# Patient Record
Sex: Female | Born: 1945 | Hispanic: Yes | Smoking: Never smoker
Health system: Southern US, Community
[De-identification: ages and names within clinical notes are randomized; demographics above are authoritative.]

## PROBLEM LIST (undated history)

## (undated) DIAGNOSIS — I10 Essential (primary) hypertension: Secondary | ICD-10-CM

## (undated) DIAGNOSIS — E119 Type 2 diabetes mellitus without complications: Secondary | ICD-10-CM

---

## 2017-04-09 ENCOUNTER — Emergency Department: Payer: Self-pay

## 2017-04-09 DIAGNOSIS — Z79899 Other long term (current) drug therapy: Secondary | ICD-10-CM | POA: Insufficient documentation

## 2017-04-09 DIAGNOSIS — R0602 Shortness of breath: Secondary | ICD-10-CM | POA: Insufficient documentation

## 2017-04-09 DIAGNOSIS — I10 Essential (primary) hypertension: Secondary | ICD-10-CM | POA: Insufficient documentation

## 2017-04-09 DIAGNOSIS — E119 Type 2 diabetes mellitus without complications: Secondary | ICD-10-CM | POA: Insufficient documentation

## 2017-04-09 LAB — COMPREHENSIVE METABOLIC PANEL
ALT: 18 U/L (ref 14–54)
AST: 23 U/L (ref 15–41)
Albumin: 4.7 g/dL (ref 3.5–5.0)
Alkaline Phosphatase: 72 U/L (ref 38–126)
Anion gap: 9 (ref 5–15)
BUN: 19 mg/dL (ref 6–20)
CO2: 24 mmol/L (ref 22–32)
Calcium: 9.5 mg/dL (ref 8.9–10.3)
Chloride: 97 mmol/L — ABNORMAL LOW (ref 101–111)
Creatinine, Ser: 0.6 mg/dL (ref 0.44–1.00)
GFR calc Af Amer: >60 mL/min (ref >60)
GFR calc non Af Amer: >60 mL/min (ref >60)
Glucose, Bld: 212 mg/dL — ABNORMAL HIGH (ref 65–99)
Potassium: 4.2 mmol/L (ref 3.5–5.1)
Sodium: 130 mmol/L — ABNORMAL LOW (ref 135–145)
Total Bilirubin: 0.8 mg/dL (ref 0.3–1.2)
Total Protein: 8 g/dL (ref 6.5–8.1)

## 2017-04-09 LAB — CBC WITH DIFFERENTIAL/PLATELET
Basophils Absolute: 0 10*3/uL (ref 0–0.1)
Basophils Relative: 1 %
Eosinophils Absolute: 0.2 10*3/uL (ref 0–0.7)
Eosinophils Relative: 5 %
HCT: 35.8 % (ref 35.0–47.0)
Hemoglobin: 12.5 g/dL (ref 12.0–16.0)
Lymphocytes Relative: 40 %
Lymphs Abs: 1.7 10*3/uL (ref 1.0–3.6)
MCH: 29.8 pg (ref 26.0–34.0)
MCHC: 34.8 g/dL (ref 32.0–36.0)
MCV: 85.6 fL (ref 80.0–100.0)
Monocytes Absolute: 0.3 10*3/uL (ref 0.2–0.9)
Monocytes Relative: 7 %
Neutro Abs: 2 10*3/uL (ref 1.4–6.5)
Neutrophils Relative %: 47 %
Platelets: 159 10*3/uL (ref 150–440)
RBC: 4.18 MIL/uL (ref 3.80–5.20)
RDW: 13.1 % (ref 11.5–14.5)
WBC: 4.1 10*3/uL (ref 3.6–11.0)

## 2017-04-09 LAB — TROPONIN I

## 2017-04-09 NOTE — ED Triage Notes (Signed)
Per family patient complaining of not feeling well and having shortness of breath.  Patient is visiting.

## 2017-04-10 ENCOUNTER — Encounter: Payer: Self-pay | Admitting: Emergency Medicine

## 2017-04-10 ENCOUNTER — Emergency Department
Admission: EM | Admit: 2017-04-10 | Discharge: 2017-04-10 | Disposition: A | Discharge status: Home / Self Care | Payer: Self-pay | Attending: Emergency Medicine | Admitting: Emergency Medicine

## 2017-04-10 DIAGNOSIS — R0602 Shortness of breath: Secondary | ICD-10-CM

## 2017-04-10 HISTORY — DX: Essential (primary) hypertension: I10

## 2017-04-10 HISTORY — DX: Type 2 diabetes mellitus without complications: E11.9

## 2017-04-10 LAB — TROPONIN I: Troponin I: <0.03 ng/mL (ref <0.03)

## 2017-04-10 MED ORDER — AMLODIPINE BESYLATE 5 MG PO TABS: 5.0000 mg | ORAL_TABLET | Freq: Every day | ORAL | 0 refills | Status: AC

## 2017-04-10 MED ORDER — PREGABALIN 75 MG PO CAPS: 75.0000 mg | ORAL_CAPSULE | Freq: Every day | ORAL | 0 refills | Status: AC

## 2017-04-10 NOTE — ED Provider Notes (Signed)
[facility] Emergency Department Provider Note  ____________________________________________  Time seen: Approximately 1:24 AM  I have reviewed the triage vital signs and the nursing notes.   HISTORY  Chief Complaint Shortness of Breath   HPI Debra Robbins is a 71 y.o. female with a history of diabetes and hypertension who presents for evaluation of shortness of breath. Patient is here visiting from [place]. She reports that she is on amlodipine, Coreg, and Ibersartan twice a day for her high blood pressure.Since she does not have enough amlodipine to last until she returns to [place] she has been taking it only once a day. This evening just prior to arrival patient reports that she started having whole body shaking. She was conscious during this episode, no history of seizure, no urinary or bowel loss, no postictal phase. This is usually how she feels every time her blood pressure is high. She did not check her blood pressure but took all 3 of her meds. She reports moderate constant shortness of breath associated with this episode. Upon arrival to the emergency room patient's symptoms had already resolved. She denies chest pain, dizziness, slurred speech, facial droop, you unilateral weakness or numbness of her extremities, headache. She has no history of heart failure. She denies URI symptoms. She is not a smoker. She denies family history of ischemic heart disease.  Past Medical History:  Diagnosis Date  . Diabetes mellitus without complication (HCC)   . Hypertension     There are no active problems to display for this patient.   History reviewed. No pertinent surgical history.  Prior to Admission medications   Medication Sig Start Date End Date Taking? Authorizing Provider  amLODipine (NORVASC) 5 MG tablet Take 5 mg by mouth 2 (two) times daily.   Yes [provider]  carvedilol (COREG) 12.5 MG tablet Take 12.5 mg by mouth 2 (two)  times daily with a meal.   Yes [provider]  cilostazol (PLETAL) 100 MG tablet Take 100 mg by mouth daily.   Yes [provider]  dapagliflozin propanediol (FARXIGA) 10 MG TABS tablet Take 10 mg by mouth daily.   Yes [provider]  irbesartan (AVAPRO) 150 MG tablet Take 150 mg by mouth 2 (two) times daily.   Yes [provider]  pregabalin (LYRICA) 75 MG capsule Take 75 mg by mouth at bedtime.   Yes [provider]  amLODipine (NORVASC) 5 MG tablet Take 1 tablet (5 mg total) by mouth daily. 04/10/17 05/10/17  Nita Sickle, MD  pregabalin (LYRICA) 75 MG capsule Take 1 capsule (75 mg total) by mouth at bedtime. 04/10/17 04/20/17  Nita Sickle, MD    Allergies Patient has no known allergies.  Family History No fh of ischemic heart disease, asthma, or COPD  Social History Social History  Substance Use Topics  . Smoking status: Never Smoker  . Smokeless tobacco: Never Used  . Alcohol use No    Review of Systems  Constitutional: Negative for fever. + whole body shaking Eyes: Negative for visual changes. ENT: Negative for sore throat. Neck: No neck pain  Cardiovascular: Negative for chest pain. Respiratory: + shortness of breath. Gastrointestinal: Negative for abdominal pain, vomiting or diarrhea. Genitourinary: Negative for dysuria. Musculoskeletal: Negative for back pain. Skin: Negative for rash. Neurological: Negative for headaches, weakness or numbness. Psych: No SI or HI  ____________________________________________   PHYSICAL EXAM:  VITAL SIGNS: ED Triage Vitals  Enc Vitals Group     BP 04/09/17 2303 Marland Kitchen)  179/94     Pulse Rate 04/09/17 2303 80     Resp 04/09/17 2303 (!) 22     Temp 04/09/17 2306 97.9 F (36.6 C)     Temp Source 04/09/17 2306 Oral     SpO2 04/09/17 2303 98 %     Weight 04/09/17 2307 138 lb (62.6 kg)     Height --      Head Circumference --      Peak Flow --      Pain Score --      Pain Loc  --      Pain Edu? --      Excl. in GC? --     Constitutional: Alert and oriented. Well appearing and in no apparent distress. HEENT:      Head: Normocephalic and atraumatic.         Eyes: Conjunctivae are normal. Sclera is non-icteric.       Mouth/Throat: Mucous membranes are moist.       Neck: Supple with no signs of meningismus. Cardiovascular: Regular rate and rhythm. No murmurs, gallops, or rubs. 2+ symmetrical distal pulses are present in all extremities. No JVD. Respiratory: Normal respiratory effort. Lungs are clear to auscultation bilaterally. No wheezes, crackles, or rhonchi.  Gastrointestinal: Soft, non tender, and non distended with positive bowel sounds. No rebound or guarding. Musculoskeletal: Nontender with normal range of motion in all extremities. No edema, cyanosis, or erythema of extremities. Neurologic: Normal speech and language. A & O x3, PERRL, no nystagmus, CN II-XII intact, motor testing reveals good tone and bulk throughout. There is no evidence of pronator drift or dysmetria. Muscle strength is 5/5 throughout. Deep tendon reflexes are 2+ throughout with downgoing toes. Sensory examination is intact. Gait is normal. Skin: Skin is warm, dry and intact. No rash noted. Psychiatric: Mood and affect are normal. Speech and behavior are normal.  ____________________________________________   LABS (all labs ordered are listed, but only abnormal results are displayed)  Labs Reviewed  COMPREHENSIVE METABOLIC PANEL - Abnormal; Notable for the following:       Result Value   Sodium 130 (*)    Chloride 97 (*)    Glucose, Bld 212 (*)    All other components within normal limits  CBC WITH DIFFERENTIAL/PLATELET  TROPONIN I  TROPONIN I   ____________________________________________  EKG  ED ECG REPORT I, Nita Sickle, the attending physician, personally viewed and interpreted this ECG.  Normal sinus rhythm, rate of 75, normal intervals, normal axis, no ST  elevations or depressions, Q-wave in lead 3, and anterior leads. No prior for comparison.  ____________________________________________  RADIOLOGY  CXR: Slight linear fibrosis or atelectasis in the lung bases. No consolidation or edema.  ____________________________________________   PROCEDURES  Procedure(s) performed: None Procedures Critical Care performed:  None ____________________________________________   INITIAL IMPRESSION / ASSESSMENT AND PLAN / ED COURSE  71 y.o. female with a history of diabetes and hypertension who presents for evaluation of short lived episode of whole body tremor and shortness of breath which patient usually gets when her blood pressure is high, in the setting of noncompliance with her medication. Patient's blood pressure in the emergency room is 179/94. She is asymptomatic at this time. EKG with no acute ischemic changes. Troponin 1 is negative. Chest x-ray with no acute findings. We'll monitor patient for 3 hours and cycle cardiac enzymes. His troponin 2 is negative we'll plan to discharge home and provide patient with a prescription for amlodipine    ----------------------------------------- 12:57  PM on 04/09/2017 -----------------------------------------   OBSERVATION CARE: This patient is being placed under observation care for the following reasons: Chest pain with repeat testing to rule out ischemia  ----------------------------------------- 01:57 AM on 04/10/2017 -----------------------------------------  Patient remains stable with no SOB or CP.  ----------------------------------------- 2:37 AM on 04/10/2017 -----------------------------------------   END OF OBSERVATION STATUS: After an appropriate period of observation, this patient is being discharged due to the following reason(s):  Patient remains asymptomatic. Troponin 2 is negative. She is going to be discharged home at this time and recommended follow-up in the emergency room  if her symptoms recur. She is provided with a prescription for her home medications that she does not have so they can last until she returns to [place].     Pertinent labs & imaging results that were available during my care of the patient were reviewed by me and considered in my medical decision making (see chart for details).    ____________________________________________   FINAL CLINICAL IMPRESSION(S) / ED DIAGNOSES  Final diagnoses:  SOB (shortness of breath)      NEW MEDICATIONS STARTED DURING THIS VISIT:  New Prescriptions   AMLODIPINE (NORVASC) 5 MG TABLET    Take 1 tablet (5 mg total) by mouth daily.   PREGABALIN (LYRICA) 75 MG CAPSULE    Take 1 capsule (75 mg total) by mouth at bedtime.     Note:  This document was prepared using Dragon voice recognition software and may include unintentional dictation errors.    Nita SickleVeronese, [place], MD 04/10/17 [phone]

## 2018-02-18 IMAGING — CR DG CHEST 2V
1 series · 2 of 2 positions shown · non-contrast
Comparison: None.

CLINICAL DATA: Patient complains of not feeling well and shortness
of breath today.

EXAM:
CHEST  2 VIEW

[Series 1: dg chest 2 view · 0.14mm/px · 2 of 2 slices shown]
[im 1/2]
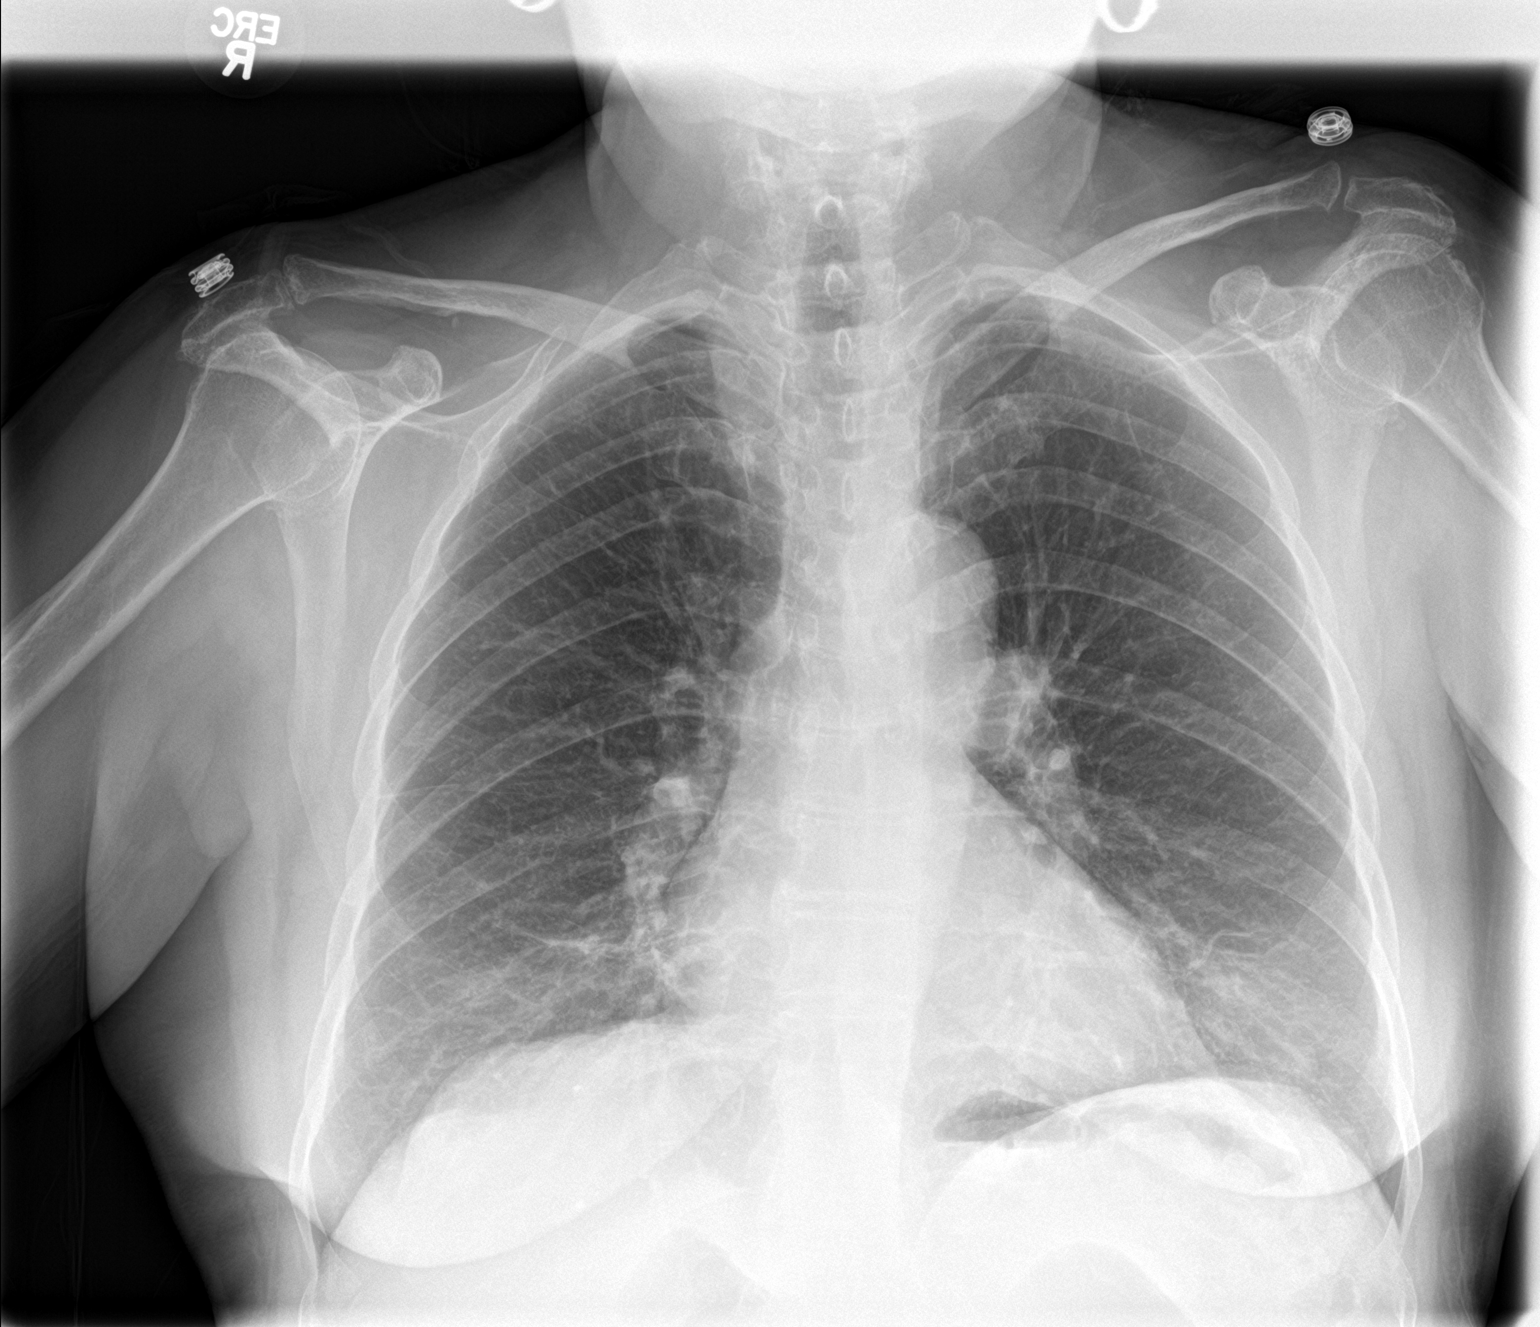
[im 2/2]
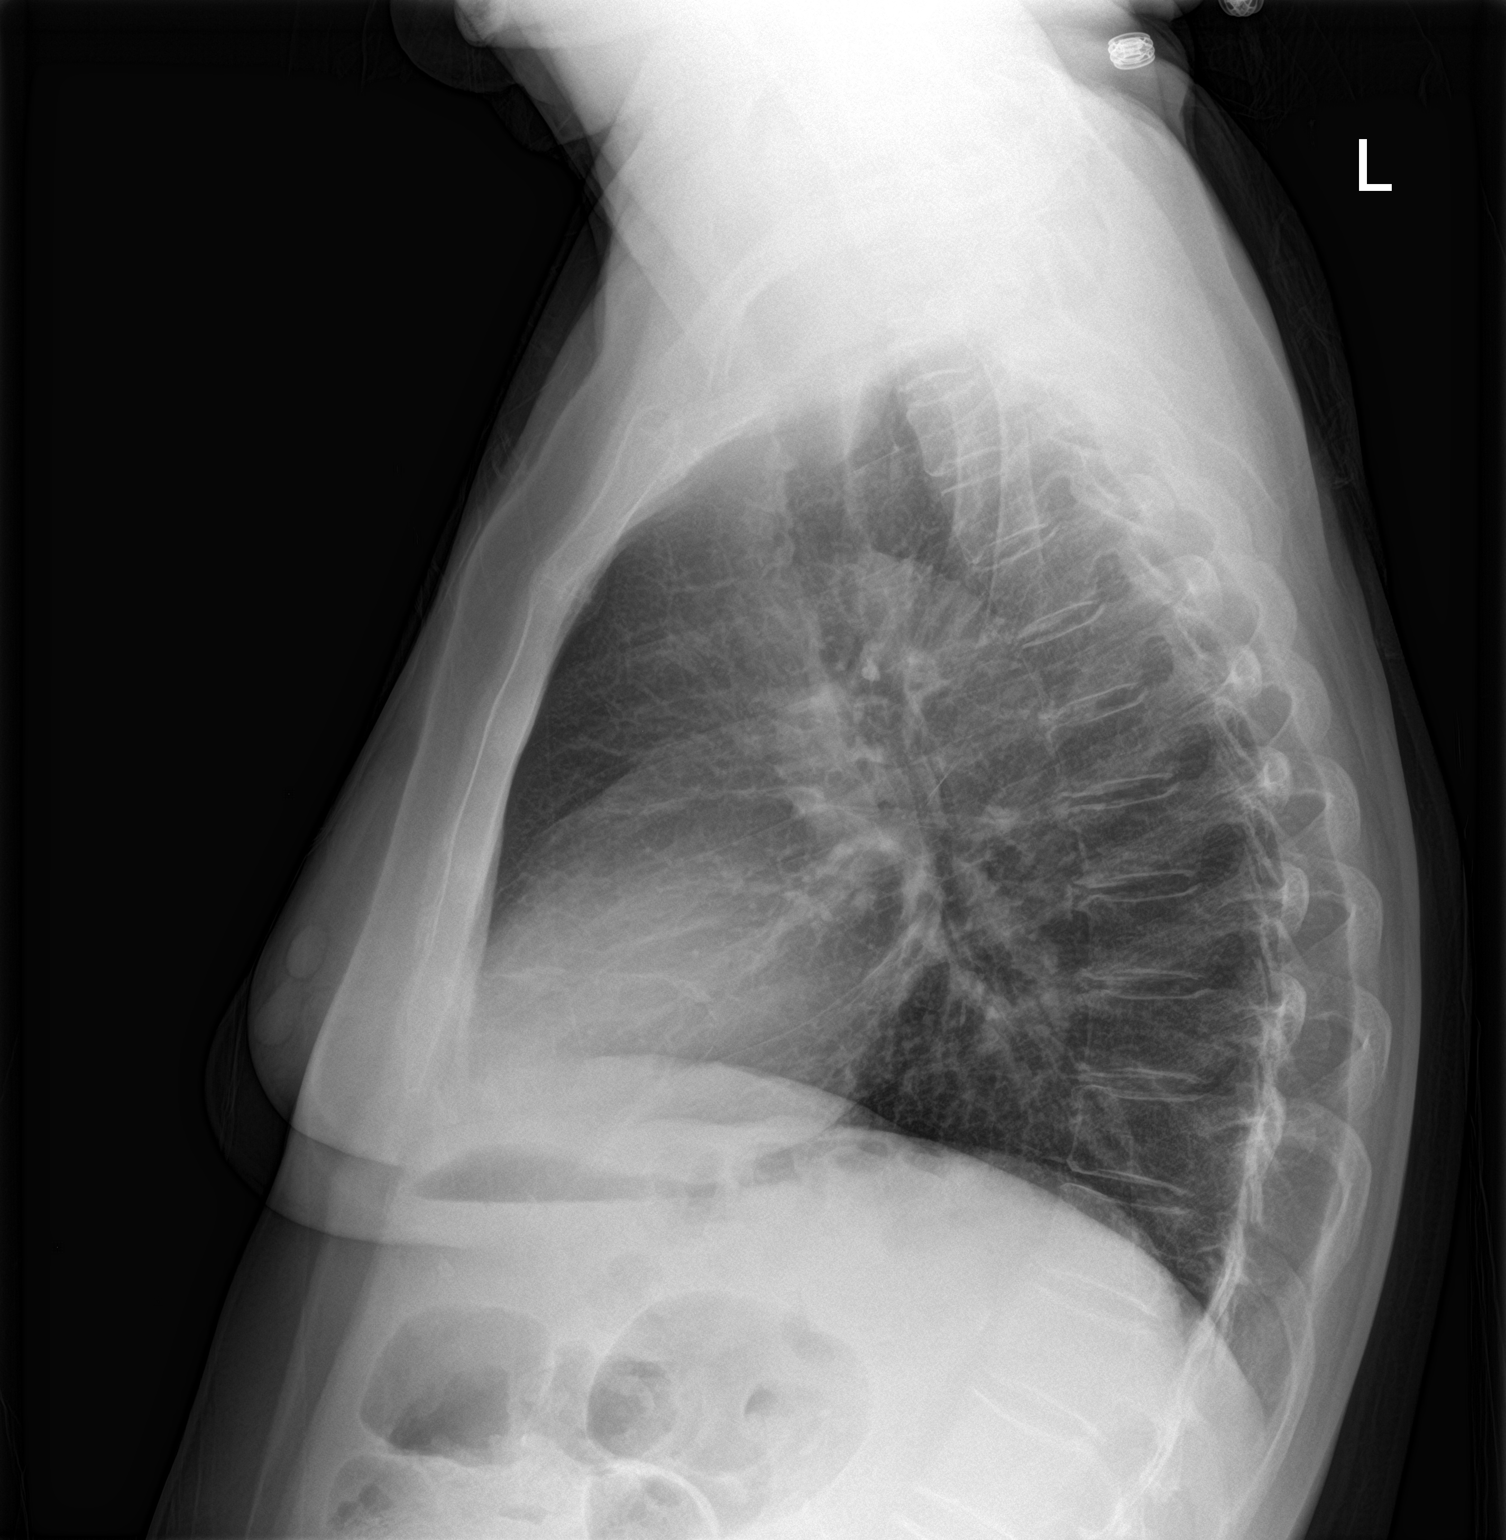

[2 of 2 positions shown; findings below may reference images not displayed]

FINDINGS: Slight linear atelectasis or fibrosis in the lung bases. Normal
heart size and pulmonary vascularity. No focal airspace disease or
consolidation in the lungs. No blunting of costophrenic angles. No
pneumothorax. Mediastinal contours appear intact. Calcification of
the aorta. Degenerative changes in the spine and shoulders.
IMPRESSION: Slight linear fibrosis or atelectasis in the lung bases. No
consolidation or edema.
